# Patient Record
Sex: Female | Born: 1990 | Hispanic: Yes
Health system: Southern US, Community
[De-identification: ages and names within clinical notes are randomized; demographics above are authoritative.]

## PROBLEM LIST (undated history)

## (undated) DIAGNOSIS — O0930 Supervision of pregnancy with insufficient antenatal care, unspecified trimester: Secondary | ICD-10-CM

## (undated) DIAGNOSIS — O365990 Maternal care for other known or suspected poor fetal growth, unspecified trimester, not applicable or unspecified: Secondary | ICD-10-CM

## (undated) DIAGNOSIS — D649 Anemia, unspecified: Secondary | ICD-10-CM

## (undated) HISTORY — DX: Maternal care for other known or suspected poor fetal growth, unspecified trimester, not applicable or unspecified: O36.5990

## (undated) HISTORY — DX: Supervision of pregnancy with insufficient antenatal care, unspecified trimester: O09.30

---

## 2011-10-21 ENCOUNTER — Encounter (HOSPITAL_COMMUNITY): Payer: Self-pay | Admitting: Physical Medicine and Rehabilitation

## 2011-10-21 ENCOUNTER — Emergency Department (HOSPITAL_COMMUNITY)
Admission: EM | Admit: 2011-10-21 | Discharge: 2011-10-21 | Disposition: A | Discharge status: Home / Self Care | Payer: Worker's Compensation | Attending: Emergency Medicine | Admitting: Emergency Medicine

## 2011-10-21 DIAGNOSIS — S51809A Unspecified open wound of unspecified forearm, initial encounter: Secondary | ICD-10-CM | POA: Insufficient documentation

## 2011-10-21 DIAGNOSIS — W268XXA Contact with other sharp object(s), not elsewhere classified, initial encounter: Secondary | ICD-10-CM | POA: Insufficient documentation

## 2011-10-21 DIAGNOSIS — Y990 Civilian activity done for income or pay: Secondary | ICD-10-CM | POA: Insufficient documentation

## 2011-10-21 DIAGNOSIS — S51812A Laceration without foreign body of left forearm, initial encounter: Secondary | ICD-10-CM

## 2011-10-21 HISTORY — DX: Anemia, unspecified: D64.9

## 2011-10-21 NOTE — ED Provider Notes (Signed)
Medical screening examination/treatment/procedure(s) were performed by non-physician practitioner and as supervising physician I was immediately available for consultation/collaboration.  Ethelda Chick, MD 10/21/11 0900

## 2011-10-21 NOTE — ED Provider Notes (Signed)
History     CSN: [id number]  Arrival date & time 10/21/11  0703   First MD Initiated Contact with Patient 10/21/11 [phone]      Chief Complaint  Patient presents with  . Extremity Laceration    (Consider location/radiation/quality/duration/timing/severity/associated sxs/prior treatment) Patient is a 21 y.o. female presenting with skin laceration. The history is provided by the patient. The history is limited by a language barrier. A language interpreter was used.  Laceration  The incident occurred 1 to 2 hours ago. The laceration is located on the left arm. The laceration is 2 cm in size. The laceration mechanism was a broken glass. The pain is mild. She reports no foreign bodies present. Her tetanus status is UTD.  Denies distal weakness or numbness. Injury occurred at work.  Past Medical History  Diagnosis Date  . Anemia     No past surgical history on file.  No family history on file.  History  Substance Use Topics  . Smoking status: Never Smoker   . Smokeless tobacco: Not on file  . Alcohol Use: No    OB History    Grav Para Term Preterm Abortions TAB SAB Ect Mult Living                  Review of Systems  Constitutional: Negative for fever and chills.  Skin: Positive for wound.  Neurological: Negative for weakness and numbness.    Allergies  Review of patient's allergies indicates no known allergies.  Home Medications  No current outpatient prescriptions on file.  BP 123/69  Pulse 82  Temp 98.5 F (36.9 C) (Oral)  Resp 16  SpO2 99%  Physical Exam  Nursing note reviewed. Constitutional: She appears well-developed and well-nourished. No distress.       Vital signs are reviewed and are normal.  HENT:  Head: Normocephalic and atraumatic.  Eyes: Conjunctivae are normal.  Neck: Neck supple.  Cardiovascular: Normal rate.        Bilateral radial pulses 2+  Pulmonary/Chest: Effort normal. No respiratory distress.  Musculoskeletal: Normal range of  motion. She exhibits tenderness (only over area of laceration). She exhibits no edema.       Left elbow: Normal.       Left wrist: Normal.       Left forearm: She exhibits laceration.       Left hand: Normal. She exhibits normal capillary refill. Normal strength noted.  Neurological: She is alert.       Sensation intact to light touch in BUE.   Skin:       ED Course  Procedures (including critical care time)  Labs Reviewed - No data to display No results found.  LACERATION REPAIR Performed by: Lorenz Coaster Consent: Verbal consent obtained. Risks and benefits: risks, benefits and alternatives were discussed Patient identity confirmed: provided demographic data Time out performed prior to procedure Prepped and Draped in normal sterile fashion Wound explored  Laceration Location: left forearm  Laceration Length: 2cm  No Foreign Bodies seen or palpated  Anesthesia: local infiltration  Local anesthetic: lidocaine 2% without epinephrine  Anesthetic total: 1.5 ml  Irrigation method: syringe Amount of cleaning: standard  Skin closure: Ethilon  Number of sutures or staples: 3  Technique: simple interrupted  Patient tolerance: Patient tolerated the procedure well with no immediate complications. Bacitracin ointment, sterile dressing placed after repair by myself.   Dx 1: Left forearm laceration   MDM  Laceration left forearm. Tetanus UTD. Denies possibility of FB. Will  repair wound.        Shaaron Adler, [place] 10/21/11 [phone]

## 2011-10-21 NOTE — ED Notes (Signed)
Patient has laceration to right forearm. Injury at work with glass. Patient was given interrupter phones to communicate.

## 2011-10-21 NOTE — ED Notes (Signed)
Pt presents to department for evaluation of laceration to L forearm. Pt does not speak english, interpreter phones used in triage. States she was working at [employer] when a piece of glass cut her arm. Tetanus unknown. Bleeding controlled upon arrival to ED. She is alert and oriented x4. No acute distress noted.

## 2012-03-02 ENCOUNTER — Ambulatory Visit: Payer: Self-pay | Admitting: Family Medicine

## 2012-03-02 VITALS — BP 109/69 | HR 80 | Temp 99.1°F | Resp 18 | Ht 64.0 in | Wt 110.0 lb

## 2012-03-02 DIAGNOSIS — R110 Nausea: Secondary | ICD-10-CM

## 2012-03-02 DIAGNOSIS — Z3490 Encounter for supervision of normal pregnancy, unspecified, unspecified trimester: Secondary | ICD-10-CM

## 2012-03-02 DIAGNOSIS — Z331 Pregnant state, incidental: Secondary | ICD-10-CM

## 2012-03-02 LAB — POCT URINE PREGNANCY: Preg Test, Ur: POSITIVE

## 2012-03-02 MED ORDER — PRENATAL VITAMINS (DIS) PO TABS: 1.0000 | ORAL_TABLET | Freq: Every day | ORAL | Status: DC

## 2012-03-02 NOTE — Patient Instructions (Addendum)
Embarazo  Primer trimestre  (Pregnancy - First Trimester)  Durante el acto sexual, millones de espermatozoides entran en la vagina. Slo 1 espermatozoide penetra y fertiliza al vulo mientras se encuentra en la trompa de Falopio. Una semana ms tarde, el vulo fertilizado se implanta en la pared del tero. Un embrin comienza a desarrollarse para ser un beb. A las 6 a 8 semanas se forman los ojos y la cara y los latidos del corazn se pueden ver en la ecografa. Al final de las 12 semanas (primer trimestre) todos los rganos del beb estn formados. Ahora que est embarazada, querr hacer todo lo que est a su alcance para tener un beb sano. Dos de las cosas ms importantes son: tener una buena atencin prenatal y seguir las indicaciones del profesional que la asiste. La atencin prenatal incluye toda la asistencia mdica que usted recibe antes del nacimiento del beb. Se lleva a cabo para prevenir y tratar problemas durante el embarazo y el parto. EXAMENES PRENATALES   Durante las visitas prenatales se controlan el peso, la presin arterial y se solicitan anlisis de orina. Esto se hace para asegurarse de que usted est sana y el embarazo progrese normalmente.  Una mujer embarazada debe aumentar de 25 a 35 libras durante el embarazo. Sin embargo, si usted tiene sobrepeso o bajo peso, su mdico le aconsejar qu hacer.  El podr hacerle preguntas y responder todas las que usted le haga.  Durante los exmenes prenatales se solicitan anlisis de sangre, cultivos del tero, un [place] y otros anlisis necesarios. Estas pruebas se realizan para controlar su salud y la del beb. Se recomienda que se haga la prueba para el diagnstico del VIH, con su autorizacin. Este es el virus que causa el SIDA. Estas pruebas se realizan porque existen medicamentos que podran administrarle para prevenir que el beb nazca con esta infeccin, si usted estuviera infectada y no lo supiera. Los anlisis de sangre tambin  se realizan para determinar el tipo de sangre, si tuvo infecciones previas y controlar sus niveles en la sangre (hemoglobina).  Tener un recuento bajo de hemoglobina (anemia) es comn durante el embarazo. Para prevenirla, se administran hierro y vitaminas. En una etapa ms avanzada del embarazo, le indicarn exmenes de sangre para saber si tiene diabetes, junto con otros anlisis, en caso de que tuviera problemas. Es necesario que se haga las pruebas para asegurarse de que usted y el beb estn bien.  Es posible que necesite otras pruebas adicionales. CAMBIOS DURANTE EL PRIMER TRIMESTRE (LOS TRES PRIMEROS MESES DEL EMBARAZO).  Su organismo atravesar numerosos cambios durante el embarazo. Estos pueden variar de una persona a otra. Converse con el mdico acerca los cambios que usted nota y que la preocupan. Ellos son:   El perodo menstrual se detiene.  El vulo y los espermatozoides llevan los genes que determinan cmo seremos. Sus genes y los de su pareja forman el beb. Los genes del varn determinan si ser un nio o una nia.  La circunferencia de la cintura va a ir aumentando y podr sentirse hinchada.  Puede tener malestar estomacal (nuseas) y vmitos. Si no puede controlar los vmitos, consulte a su mdico.  Sus mamas comenzarn a agrandarse y sensibilizarse.  Los pezones pueden sobresalir ms y ser ms oscuros.  Tendr necesidad de orinar ms. El dolor al orinar puede significar que usted tiene una infeccin de la vejiga.  Se cansar con facilidad.  Prdida del apetito.  Sentir un fuerte deseo de consumir ciertos alimentos.    Al principio, usted puede ganar o perder un par de kilos.  Podr tener cambios emocionales de un da a otro (entusiasmo por estar embarazada o preocupacin por el embarazo y el beb).  Tendr sueos ms vvidos y extraos. INSTRUCCIONES PARA EL CUIDADO EN EL HOGAR   Es muy importante evitar el cigarrillo, el alcohol y los frmacos no recetados  durante el embarazo. Estas sustancias afectan la formacin y el desarrollo del beb. Evite los productos qumicos durante el embarazo para asegurar la salud del beb.  Comience las consultas prenatales alrededor de la 12 semana de embarazo. Generalmente se programan cada mes al principio y se hacen ms frecuentes en los 2 ltimos meses antes del parto. Cumpla con las citas de control. Siga las indicaciones del mdico con respecto al uso de medicamentos, los anlisis y pruebas de laboratorio, los ejercicios y la dieta.  Durante el embarazo debe obtener nutrientes para usted y para su beb. Consuma alimentos balanceados. Elija alimentos como carne, pescado, leche y otros productos lcteos descremados, vegetales, frutas, panes integrales y cereales. El mdico le informar cul es el aumento de peso ideal.  Las nuseas matinales pueden aliviarse si come algunas galletitas saladas en la cama. Coma dos galletitas antes de levantarse por la maana. Tambin puede comer galletitas sin sal.  Hacer 4 o 5 comidas pequeas en lugar de 3 comidas grandes por da tambin puede aliviar las nuseas y los vmitos.  Beber lquidos entre las comidas en lugar de tomarlos durante las comidas tambin puede ayudar a calmar las nuseas y los vmitos.  Puede continuar teniendo relaciones sexuales durante todo el embarazo si no hay otros problemas. Los problemas pueden ser una prdida precoz (prematura) de lquido amnitico, sangrado vaginal, o dolor en el vientre (abdominal).  Realice actividad fsica todos los das, si no tiene restricciones. Consulte con su mdico o terapeuta fsico si no est segura de algunos de sus ejercicios. El mayor aumento de peso se producir en los ltimos 2 trimestres del embarazo. El ejercicio le ayudar a:  Controlar su peso.  Mantenerse en forma.  Prepararse para el parto.  La ayudar a perder el peso del embarazo despus de que nazca su beb.  Use un buen sostn o como los que se usan  para hacer deportes para aliviar la sensibilidad de las mamas. Tambin puede serle til si lo usa mientras duerme.  Consulte cuando puede comenzar con las clases de pre parto. Comience con las clases cuando estn disponibles.  No utilice la baera con agua caliente, baos turcos y saunas.  Colquese el cinturn de seguridad cuando conduzca. Este la proteger a usted y al beb en caso de accidente.  Evite comer carne cruda y el contacto con los utensilios y desperdicios de los gatos. Estos elementos contienen grmenes que pueden causar defectos de nacimiento en el beb.  El primer trimestre es un buen momento para visitar a su dentista y evaluar su salud dental. Es importante mantener los dientes limpios. Use un cepillo de dientes suave y cepllese con ms suavidad durante el embarazo.  Pida ayuda si tienen necesidades financieras, teraputicas o nutricionales. El profesional podr ayudarla con respecto a estas necesidades, o derivarla a otros especialistas.  No tome medicamentos o hierbas excepto aquellos que le indic el profesional.  Informe a su mdico si sufre violencia familiar mental o fsica.  Haga una lista de nmeros de telfono de emergencia de la familia, los amigos, el hospital y los departamentos de polica y bomberos.  Escriba sus   preguntas. Llvelas cuando concurra a su visita prenatal.  No se haga duchas vaginales.  No cruce las piernas.  Si usted tiene que estar parada por largos perodos de tiempo, gire los pies o de pequeos pasos en crculo.  Es posible que tenga ms secreciones vaginales que puedan requerir una toalla higinica. No use tampones o toallas higinicas perfumadas. EL CONSUMO DE MEDICAMENTOS Y FRMACOS DURANTE EL EMBARAZO   Tome las vitaminas para la etapa prenatal tal como se le indic. Las vitaminas deben contener un miligramo de cido flico. Guarde todas las vitaminas fuera del alcance de los nios. La ingestin de slo un par de vitaminas o  tabletas que contengan hierro pueden ocasionar la muerte en un beb o en un nio pequeo.  Evite el uso de todos los medicamentos, incluyendo hierbas, medicamentos de venta libre, sin receta o que no hayan sido sugeridos por su mdico. Slo tome medicamentos de venta libre o medicamentos recetados para el dolor, el malestar o fiebre como lo indique su mdico. No tome aspirina, ibuprofeno o naproxeno excepto que su mdico se lo indique.  Infrmele al profesional si consume medicamentos de hierbas.  El alcohol se relaciona con ciertos defectos congnitos. Incluye el sndrome de alcoholismo fetal. Debe evitar absolutamente el consumo de alcohol, en cualquier forma. El fumar causar baja tasa de natalidad y bebs prematuros.  Las drogas ilegales o de la calle son muy perjudiciales para el beb. Estn absolutamente prohibidas. Un beb que nace de una madre adicta, ser adicto al nacer. Ese beb tendr los mismos sntomas de abstinencia que un adulto.  Informe a su mdico acerca de los medicamentos que ha tomado y el motivo por el que los tom. EL ABORTO ESPONTNEO ES COMN DURANTE EL EMBARAZO  Esto no significa que haya hecho algo mal. No es un motivo para preocuparse en caso de un nuevo embarazo. El profesional que la asiste la ayudar si tiene preguntas para formular. Si tiene un aborto espontneo podr requerir una ciruga menor.  SOLICITE ATENCIN MDICA SI:  Tiene preguntas o preocupaciones relacionadas con el embarazo. Es mejor que llame para formular las preguntas si no puede esperar hasta la prxima visita, que sentirse preocupada por ellas.  SOLICITE ATENCIN MDICA DE INMEDIATO SI:   La temperatura oral le sube a ms de 102 F (38.9 C) o lo que su mdico le indique.  Tiene una prdida de lquido por la vagina (canal de parto). Si sospecha una ruptura de las membranas, tmese la temperatura y llame al profesional para informarlo sobre esto.  Observa unas pequeas manchas o una hemorragia  vaginal. Notifique al profesional acerca de la cantidad y de cuntos apsitos est utilizando.  Presenta un olor desagradable en la secrecin vaginal y observa un cambio en el color.  Contina con las nuseas y no obtiene alivio de los remedios indicados. Vomita sangre o algo similar a la borra del caf.  Pierde ms de 2 libras (1 Kg) en una semana.  Aumenta ms de 1 Kg en una semana y nota el rostro, las manos, los pies o las piernas hinchados.  Aumenta ms de 2,5 Kg en una semana (aunque no tenga las manos, pies, piernas o el rostro hinchados).  Ha estado expuesta a la rubola y no ha sufrido la enfermedad.  Ha estado expuesta a la quinta enfermedad o a la varicela.  Siente dolor en el vientre (abdominal). Las molestias en el ligamento redondo son una causa benigna frecuente de dolor abdominal durante el embarazo. El   profesional que la asiste deber evaluarla.  Presenta dolor de cabeza, fiebre, diarrea, dolor al orinar o le falta la respiracin.  Se cae, se ve involucrada en un accidente automovilstico o sufre algn tipo de traumatismo.  En su hogar hay violencia mental o fsica. Document Released: 01/12/2005 Document Revised: 10/04/2011 [employer] Patient Information 2013 [employer].  

## 2012-03-02 NOTE — Progress Notes (Signed)
Subjective:    Patient ID: Angelica Montgomery, female    DOB: 1990/11/01, 21 y.o.   MRN: [id number]  HPI Angelica Montgomery is a 21 y.o. female  Here for pregnancy test.  Feels nauseous, no vomiting.  Has not been eating much past few days. Drinking fluids ok.  Home pregnancy test positive 2 days ago.  Drinking Ensure.  Unintentional pregnancy, but plans on having it. Headache this week - no medicine.   LMP: October 1st.  Spanish speaking only - here with interpreter.   Review of Systems  Gastrointestinal: Positive for nausea. Negative for vomiting.  Genitourinary: Negative for vaginal bleeding, vaginal pain and pelvic pain.  Neurological: Positive for headaches.   Works at [employer] - unable to work this past week due to nausea.     Objective:   Physical Exam  Vitals reviewed. Constitutional: She appears well-developed and well-nourished.  HENT:  Head: Normocephalic and atraumatic.  Pulmonary/Chest: Effort normal and breath sounds normal.  Abdominal: Soft. She exhibits no distension and no mass. There is no tenderness. There is no guarding.  Skin: Skin is warm and dry.  Psychiatric: She has a normal mood and affect.    Results for orders placed in visit on 03/02/12  POCT URINE PREGNANCY      Component Value Range   Preg Test, Ur Positive        Assessment & Plan:  Angelica Montgomery is a 21 y.o. female 1. Pregnancy  POCT urine pregnancy, Prenatal Vitamins (DIS) TABS  2. Nausea alone     Unintentional pregnancy, but plans on continuing pregnancy.  PNV QD , handout on  Common questions.  Discussed avoiding otc meds other than tylenol for now, tips on nausea mgt. Phone numbers to [facility] and other Obstetricians in area given for her to call and schedule initial appointment.  rtc precautions discussed.  Out of work letter provided, but if nausea not improved to be able to work after one week of home care - rtc to discuss other mgt options.  Headache likely with  decreased appetite. Try otc tylenol.  rtc if worsening or not improving.   Patient Instructions  Risk manager trimestre  (Pregnancy - First Trimester)  Durante el acto sexual, millones de espermatozoides entran en la vagina. Slo 1 espermatozoide penetra y fertiliza al vulo mientras se encuentra en la trompa de [place]. Una semana ms tarde, el vulo fertilizado se implanta en la pared del tero. Un embrin comienza a desarrollarse para ser un beb. A las 6 a 8 semanas se forman los ojos y la cara y los latidos del corazn se pueden ver en la ecografa. Al final de las 12 semanas (primer trimestre) todos los rganos del beb estn formados. Ahora que est embarazada, querr hacer todo lo que est a su alcance para tener un beb sano. Dos de las cosas ms importantes son: Winferd Humphrey buena atencin prenatal y seguir las indicaciones del profesional que la asiste. La atencin prenatal incluye toda la asistencia mdica que usted recibe antes del nacimiento del beb. Se lleva a cabo para prevenir y tratar problemas durante el [address] y [place]. EXAMENES PRENATALES   Durante las visitas prenatales se [employer], la presin arterial y se solicitan anlisis de [place]. Esto se hace para asegurarse de que usted est sana y el embarazo progrese normalmente.  Una mujer embarazada debe aumentar de 25 a 35 libras durante el embarazo. Sin embargo, si usted tiene sobrepeso o bajo  peso, su mdico Administrator, Civil Service.  El podr hacerle preguntas y responder todas las que usted le haga.  Durante los exmenes prenatales se solicitan anlisis de [place], cultivos del [place], un [place] y otros anlisis necesarios. Estas pruebas se realizan para controlar su salud y la del beb. Se recomienda que se haga la prueba para el diagnstico del VIH, con su autorizacin. Este es el virus que causa el [place]. Estas pruebas se realizan porque existen medicamentos que podran administrarle para prevenir que el beb  nazca con esta infeccin, si usted estuviera infectada y no lo supiera. Los [employer] Corporation de sangre tambin se Radiographer, therapeutic para Warehouse manager tipo de [place], si tuvo infecciones previas y Chief Operating Officer sus niveles en la sangre (hemoglobina).  Tener un recuento bajo de hemoglobina (anemia) es comn durante Firefighter. Para prevenirla, se administran hierro y vitaminas. En una etapa ms avanzada del [place], le indicarn exmenes de sangre para saber si tiene diabetes, junto con otros anlisis, en caso de que [place]. Es necesario que se haga las pruebas para asegurarse de que usted y el beb estn bien.  Es posible que necesite otras pruebas adicionales. CAMBIOS DURANTE EL PRIMER TRIMESTRE (LOS TRES PRIMEROS MESES DEL EMBARAZO).  Su organismo atravesar numerosos cambios Academic librarian. Estos pueden variar de Neomia Dear persona a otra. Converse con el mdico acerca los cambios que usted nota y que la preocupan. Ellos son:   El perodo menstrual se detiene.  El vulo y los espermatozoides llevan los genes que determinan cmo seremos. Sus genes y los de su pareja forman el beb. Los genes del varn determinan si ser un nio o una nia.  La circunferencia de la cintura va a ir aumentando y podr sentirse hinchada.  Puede tener Programme researcher, broadcasting/film/video (nuseas) y vmitos. Si no puede controlar los vmitos, consulte a su mdico.  Sus mamas comenzarn a agrandarse y sensibilizarse.  Los pezones pueden sobresalir ms y ser ms oscuros.  Tendr necesidad de orinar ms. El dolor al orinar puede significar que usted tiene una infeccin de la vejiga.  Se cansar con facilidad.  Prdida del apetito.  Sentir un fuerte deseo de consumir ciertos alimentos.  Al principio, usted puede ganar o perder un par de kilos.  Podr tener cambios emocionales de un da a otro (entusiasmo por estar embarazada o preocupacin por Firefighter y el beb).  Tendr sueos ms vvidos y extraos. INSTRUCCIONES PARA EL  CUIDADO EN EL HOGAR   Es muy importante evitar el cigarrillo, el alcohol y los frmacos no recetados Academic librarian. Estas sustancias afectan la formacin y el desarrollo del beb. Evite los productos qumicos durante el embarazo para Games developer salud del beb.  Comience las consultas prenatales alrededor de la 12 semana de [place]. Generalmente se programan cada mes al principio y se hacen ms frecuentes en los 2 ltimos meses antes del parto. Cumpla con las citas de control. Siga las indicaciones del mdico con respecto al uso de [place], los anlisis y pruebas de [place], los ejercicios y Psychologist, forensic.  Durante el embarazo debe obtener nutrientes para usted y para su beb. Consuma alimentos balanceados. Elija alimentos como carne, pescado, [place] y otros productos lcteos descremados, vegetales, frutas, panes integrales y cereales. El [employer]ot informar cul es el aumento de peso ideal.  Las nuseas matinales pueden aliviarse si come algunas galletitas saladas en la cama. Coma dos galletitas antes de levantarse por la maana. Tambin puede comer galletitas sin sal.  Hacer 4 o  5 comidas pequeas en lugar de 3 comidas grandes por da tambin puede Yahoo nuseas y los vmitos.  Beber lquidos [place] comidas en lugar de tomarlos durante las comidas tambin puede ayudar a Optician, dispensing las nuseas y los vmitos.  Puede continuar teniendo [employer] durante todo el embarazo si no hay otros problemas. Los problemas pueden ser una prdida precoz (prematura) de lquido amnitico, sangrado vaginal, o dolor en el vientre (abdominal).  Realice Tesoro Corporation, si no tiene restricciones. Consulte con su mdico o terapeuta fsico si no est segura de algunos de sus ejercicios. El mayor aumento de peso se producir en los ltimos 2 trimestres del Psychiatrist. El ejercicio le ayudar a:  Engineering geologist.  Mantenerse en forma.  Prepararse para el parto.  La ayudar a  perder el peso del embarazo despus de que nazca su beb.  Use un buen sostn o como los que se usan para hacer deportes para Paramedic la sensibilidad de las [place]. Tambin puede serle til si lo [place] mientras duerme.  Consulte cuando puede comenzar con las clases de pre parto. Comience con las clases cuando estn disponibles.  No utilice la baera con agua caliente, baos turcos y saunas.  Colquese el cinturn de seguridad cuando conduzca. Este la proteger a usted y al beb en caso de accidente.  Evite comer carne cruda y el contacto con los utensilios y desperdicios de los gatos. Estos elementos contienen grmenes que pueden causar defectos de nacimiento en el beb.  El primer trimestre es un buen momento para visitar a su dentista y Software engineer. Es importante mantener los dientes limpios. Use un cepillo de dientes suave y cepllese con ms suavidad durante el [employer] Lots.  Pida ayuda si tienen necesidades financieras, teraputicas o nutricionales. El profesional podr ayudarla con respecto a estas necesidades, o derivarla a otros especialistas.  No tome medicamentos o hierbas excepto aquellos que Fish farm manager.  Informe a su mdico si sufre violencia familiar mental o fsica.  Haga una lista de nmeros de telfono de Associate Professor de la familia, los amigos, el hospital y los departamentos de polica y bomberos.  Escriba sus preguntas. Llvelas cuando concurra a su visita prenatal.  No se haga duchas vaginales.  No cruce las piernas.  Si usted tiene que estar parada por largos perodos de [place], gire los pies o de pequeos pasos en crculo.  Es posible que tenga ms secreciones vaginales que puedan requerir una toalla higinica. No use tampones o toallas higinicas perfumadas. EL CONSUMO DE MEDICAMENTOS Y FRMACOS DURANTE EL EMBARAZO   Tome las vitaminas para la etapa prenatal tal como se le indic. Las vitaminas deben contener un miligramo de cido flico. Guarde  todas las vitaminas fuera del alcance de los nios. La ingestin de slo un par de vitaminas o tabletas que contengan hierro pueden ocasionar la [employer] Mining en un beb o en un nio pequeo.  Evite el uso de The Mutual of Omaha, incluyendo hierbas, medicamentos de [place], sin receta o que no hayan sido sugeridos por su mdico. Slo tome medicamentos de venta libre o medicamentos recetados para Chief Technology Officer, Environmental health practitioner o fiebre como lo indique su mdico. No tome aspirina, ibuprofeno o naproxeno excepto que su mdico se lo indique.  Infrmele al profesional si consume medicamentos de hierbas.  El alcohol se relaciona con ciertos defectos congnitos. Incluye el sndrome de alcoholismo fetal. Debe evitar absolutamente el consumo de alcohol, en cualquier forma. El fumar causar baja  tasa de natalidad y bebs prematuros.  Las drogas ilegales o de la calle son muy perjudiciales para el beb. Estn absolutamente prohibidas. Un beb que nace de [employer] Express, ser adicto al nacer. Ese beb tendr los mismos sntomas de abstinencia que un adulto.  Informe a su mdico acerca de los medicamentos que ha tomado y el motivo por el que los tom. EL ABORTO ESPONTNEO ES COMN DURANTE EL EMBARAZO  Esto no significa que haya hecho algo mal. No es un motivo para preocuparse en caso de un nuevo embarazo. El profesional que la asiste la ayudar si tiene preguntas para formular. Si tiene un aborto espontneo podr requerir Futures trader.  SOLICITE ATENCIN MDICA SI:  Tiene preguntas o preocupaciones relacionadas con el embarazo. Es mejor que llame para formular las preguntas si no puede esperar hasta la prxima visita, que sentirse preocupada por ellas.  SOLICITE ATENCIN MDICA DE INMEDIATO SI:   La temperatura oral le sube a ms de 102 F (38.9 C) o lo que su mdico le indique.  Tiene una prdida de lquido por la vagina (canal de parto). Si sospecha una ruptura de las [place], tmese la temperatura y llame  al profesional para informarlo sobre esto.  Observa unas pequeas manchas o una hemorragia vaginal. Notifique al profesional acerca de la cantidad y de cuntos apsitos est utilizando.  Presenta un olor desagradable en la secrecin vaginal y observa un cambio en el color.  Contina con las nuseas y no obtiene alivio de los remedios indicados. Vomita sangre o algo similar a la borra del caf.  Pierde ms de 2 libras (1 Kg) en una semana.  Aumenta ms de 1 Kg en una semana y [address], las manos, los pies o las piernas hinchados.  Aumenta ms de 2,5 Kg en una semana (aunque no tenga las manos, pies, piernas o el rostro hinchados).  Ha estado expuesta a la rubola y no ha sufrido la enfermedad.  Ha estado expuesta a la quinta enfermedad o a la varicela.  Siente dolor en el vientre (abdominal). Las [employer] en el ligamento redondo son Neomia Dear causa benigna frecuente de dolor abdominal durante el embarazo. El profesional que la asiste deber evaluarla.  Presenta dolor de Renne Musca, diarrea, dolor al orinar o le falta la respiracin.  Se cae, se ve involucrada en un accidente automovilstico o sufre algn tipo de traumatismo.  En su hogar hay violencia mental o fsica. Document Released: 01/12/2005 Document Revised: 10/04/2011 [facility] Patient Information 2013 [place], [place].

## 2012-03-21 ENCOUNTER — Telehealth: Payer: Self-pay | Admitting: Radiology

## 2012-03-21 NOTE — Telephone Encounter (Signed)
Patient requested letter stating her due date. Letter provided she had positive pregnancy test on 03/02/12

## 2012-04-18 NOTE — L&D Delivery Note (Signed)
Delivery Note At 4:43 PM a viable female was delivered via Vaginal, Spontaneous Delivery (Presentation: Left Occiput Anterior).  APGAR: 9, 9; weight .   Placenta status: Intact, Spontaneous.  Cord: 3 vessels with the following complications: Nuchal x 1 reduced.  Anesthesia: None  Episiotomy: None Lacerations: 2nd degree;Sulcus (left) Suture Repair: 3.0 vicryl rapide Est. Blood Loss (mL):   Mom to postpartum.  Baby to stay with mother. Pt asked with translator and declines circumcision.  Oliver Pila 10/23/2012, 5:34 PM

## 2012-07-18 ENCOUNTER — Other Ambulatory Visit: Payer: Self-pay

## 2012-07-18 DIAGNOSIS — Z3201 Encounter for pregnancy test, result positive: Secondary | ICD-10-CM

## 2012-07-19 ENCOUNTER — Encounter: Payer: Self-pay | Admitting: *Deleted

## 2012-07-19 LAB — OBSTETRIC PANEL
Eosinophils Absolute: 0 10*3/uL (ref 0.0–0.7)
Hepatitis B Surface Ag: NEGATIVE
Lymphocytes Relative: 15 % (ref 12–46)
Lymphs Abs: 1.8 10*3/uL (ref 0.7–4.0)
MCH: 30.9 pg (ref 26.0–34.0)
Neutro Abs: 9.2 10*3/uL — ABNORMAL HIGH (ref 1.7–7.7)
Neutrophils Relative %: 78 % — ABNORMAL HIGH (ref 43–77)
Platelets: 335 10*3/uL (ref 150–400)
RBC: 3.59 MIL/uL — ABNORMAL LOW (ref 3.87–5.11)
WBC: 11.8 10*3/uL — ABNORMAL HIGH (ref 4.0–10.5)

## 2012-07-20 LAB — HEMOGLOBINOPATHY EVALUATION
Hemoglobin Other: 0 %
Hgb A: 97.2 % (ref 96.8–97.8)
Hgb S Quant: 0 %

## 2012-08-27 LAB — OB RESULTS CONSOLE HIV ANTIBODY (ROUTINE TESTING): HIV: NONREACTIVE

## 2012-08-27 LAB — OB RESULTS CONSOLE ABO/RH: RH Type: POSITIVE

## 2012-08-27 LAB — OB RESULTS CONSOLE RUBELLA ANTIBODY, IGM: Rubella: IMMUNE

## 2012-08-27 LAB — OB RESULTS CONSOLE GC/CHLAMYDIA: Chlamydia: NEGATIVE

## 2012-09-13 ENCOUNTER — Other Ambulatory Visit: Payer: Self-pay | Admitting: Family Medicine

## 2012-10-22 ENCOUNTER — Telehealth (HOSPITAL_COMMUNITY): Payer: Self-pay | Admitting: *Deleted

## 2012-10-22 ENCOUNTER — Encounter (HOSPITAL_COMMUNITY): Payer: Self-pay | Admitting: *Deleted

## 2012-10-22 NOTE — Telephone Encounter (Signed)
Preadmission screen Interpreter number [phone]

## 2012-10-23 ENCOUNTER — Encounter (HOSPITAL_COMMUNITY): Payer: Self-pay

## 2012-10-23 ENCOUNTER — Inpatient Hospital Stay (HOSPITAL_COMMUNITY)
Admission: AD | Admit: 2012-10-23 | Discharge: 2012-10-25 | DRG: 775 | Disposition: A | Discharge status: Home / Self Care | Payer: Medicaid Other | Source: Ambulatory Visit | Attending: Obstetrics and Gynecology | Admitting: Obstetrics and Gynecology

## 2012-10-23 DIAGNOSIS — Z141 Cystic fibrosis carrier: Secondary | ICD-10-CM

## 2012-10-23 DIAGNOSIS — O365990 Maternal care for other known or suspected poor fetal growth, unspecified trimester, not applicable or unspecified: Secondary | ICD-10-CM | POA: Diagnosis present

## 2012-10-23 LAB — CBC
MCH: 31.1 pg (ref 26.0–34.0)
MCHC: 34.5 g/dL (ref 30.0–36.0)
MCV: 90.4 fL (ref 78.0–100.0)
Platelets: 251 10*3/uL (ref 150–400)
RBC: 3.63 MIL/uL — ABNORMAL LOW (ref 3.87–5.11)
RDW: 13.6 % (ref 11.5–15.5)

## 2012-10-23 MED ORDER — LACTATED RINGERS IV SOLN
INTRAVENOUS | Status: DC
  Administered 2012-10-23: 16:00:00 via INTRAVENOUS

## 2012-10-23 MED ORDER — CITRIC ACID-SODIUM CITRATE 334-500 MG/5ML PO SOLN: 30.0000 mL | ORAL | Status: DC | PRN

## 2012-10-23 MED ORDER — ONDANSETRON HCL 4 MG/2ML IJ SOLN: 4.0000 mg | INTRAMUSCULAR | Status: DC | PRN

## 2012-10-23 MED ORDER — ONDANSETRON HCL 4 MG/2ML IJ SOLN: 4.0000 mg | Freq: Four times a day (QID) | INTRAMUSCULAR | Status: DC | PRN

## 2012-10-23 MED ORDER — OXYTOCIN 40 UNITS IN LACTATED RINGERS INFUSION - SIMPLE MED
62.5000 mL/h | INTRAVENOUS | Status: DC
  Filled 2012-10-23: qty 1000

## 2012-10-23 MED ORDER — FLEET ENEMA 7-19 GM/118ML RE ENEM: 1.0000 | ENEMA | RECTAL | Status: DC | PRN

## 2012-10-23 MED ORDER — OXYCODONE-ACETAMINOPHEN 5-325 MG PO TABS: 1.0000 | ORAL_TABLET | ORAL | Status: DC | PRN

## 2012-10-23 MED ORDER — SENNOSIDES-DOCUSATE SODIUM 8.6-50 MG PO TABS
2.0000 | ORAL_TABLET | Freq: Every day | ORAL | Status: DC
  Administered 2012-10-24: 2 via ORAL

## 2012-10-23 MED ORDER — WITCH HAZEL-GLYCERIN EX PADS: 1.0000 "application " | MEDICATED_PAD | CUTANEOUS | Status: DC | PRN

## 2012-10-23 MED ORDER — ONDANSETRON HCL 4 MG PO TABS: 4.0000 mg | ORAL_TABLET | ORAL | Status: DC | PRN

## 2012-10-23 MED ORDER — IBUPROFEN 600 MG PO TABS
600.0000 mg | ORAL_TABLET | Freq: Four times a day (QID) | ORAL | Status: DC | PRN
  Filled 2012-10-23: qty 1

## 2012-10-23 MED ORDER — ACETAMINOPHEN 325 MG PO TABS: 650.0000 mg | ORAL_TABLET | ORAL | Status: DC | PRN

## 2012-10-23 MED ORDER — BENZOCAINE-MENTHOL 20-0.5 % EX AERO
1.0000 "application " | INHALATION_SPRAY | CUTANEOUS | Status: DC | PRN
  Filled 2012-10-23: qty 56

## 2012-10-23 MED ORDER — LANOLIN HYDROUS EX OINT: TOPICAL_OINTMENT | CUTANEOUS | Status: DC | PRN

## 2012-10-23 MED ORDER — LACTATED RINGERS IV SOLN: 500.0000 mL | INTRAVENOUS | Status: DC | PRN

## 2012-10-23 MED ORDER — PRENATAL MULTIVITAMIN CH
1.0000 | ORAL_TABLET | Freq: Every day | ORAL | Status: DC
  Administered 2012-10-24 – 2012-10-25 (×2): 1 via ORAL
  Filled 2012-10-23 (×2): qty 1

## 2012-10-23 MED ORDER — OXYTOCIN BOLUS FROM INFUSION
500.0000 mL | INTRAVENOUS | Status: DC
  Administered 2012-10-23: 500 mL via INTRAVENOUS

## 2012-10-23 MED ORDER — IBUPROFEN 600 MG PO TABS
600.0000 mg | ORAL_TABLET | Freq: Four times a day (QID) | ORAL | Status: DC
  Administered 2012-10-23 – 2012-10-25 (×9): 600 mg via ORAL
  Filled 2012-10-23 (×8): qty 1

## 2012-10-23 MED ORDER — BUTORPHANOL TARTRATE 1 MG/ML IJ SOLN: 1.0000 mg | INTRAMUSCULAR | Status: DC | PRN

## 2012-10-23 MED ORDER — SIMETHICONE 80 MG PO CHEW: 80.0000 mg | CHEWABLE_TABLET | ORAL | Status: DC | PRN

## 2012-10-23 MED ORDER — TETANUS-DIPHTH-ACELL PERTUSSIS 5-2.5-18.5 LF-MCG/0.5 IM SUSP: 0.5000 mL | Freq: Once | INTRAMUSCULAR | Status: DC

## 2012-10-23 MED ORDER — ZOLPIDEM TARTRATE 5 MG PO TABS: 5.0000 mg | ORAL_TABLET | Freq: Every evening | ORAL | Status: DC | PRN

## 2012-10-23 MED ORDER — DIBUCAINE 1 % RE OINT: 1.0000 "application " | TOPICAL_OINTMENT | RECTAL | Status: DC | PRN

## 2012-10-23 MED ORDER — DIPHENHYDRAMINE HCL 25 MG PO CAPS: 25.0000 mg | ORAL_CAPSULE | Freq: Four times a day (QID) | ORAL | Status: DC | PRN

## 2012-10-23 MED ORDER — LIDOCAINE HCL (PF) 1 % IJ SOLN
30.0000 mL | INTRAMUSCULAR | Status: DC | PRN
  Filled 2012-10-23 (×2): qty 30

## 2012-10-23 NOTE — MAU Note (Signed)
Patient is in with c/o ctx q33m since morning and vaginal bleeding. No pad, a spot of blood mixed with mucus on her perinuem. Denies lof. Reports good fetal movement

## 2012-10-23 NOTE — H&P (Signed)
Angelica Montgomery is a 22 y.o. female G1P0 at 86 6/7 weeks (EDD 10/24/12 by LMP c/w 30 week [place])  Presented to [facility] for contractions and observed about 2 hours with cervix changing from 2-3cm to 7cm and patient beginning to involuntarily push.  Pt transferred to L&Angelica and quickly progressed to complete dilation with SROM.  I arrived as pt began pushing.  Prenatal care significant for late start at 30 weeks.  Pt is spanish speaking only.  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  At 35 weeks an US showed baby at 21st %ile and pt continued to measure S<Angelica so had NST's twice weekly that remained reassuring.  Plan was induction at 40 weeks as long as testing reassuring, since late care and dating not perhaps accurate. Maternal Medical History:  Reason for admission: Contractions.   Contractions: Onset was 1-2 hours ago.   Frequency: regular.   Perceived severity is strong.    Fetal activity: Perceived fetal activity is normal.    Prenatal Complications - Diabetes: none.    OB History   Grav Para Term Preterm Abortions TAB SAB Ect Mult Living   1              Past Medical History  Diagnosis Date  . Anemia   . Late prenatal care   . IUGR, antenatal    History reviewed. No pertinent past surgical history. Family History: family history is not on file. Social History:  reports that she has never smoked. She does not have any smokeless tobacco history on file. She reports that she does not drink alcohol or use illicit drugs.   Prenatal Transfer Tool  Maternal Diabetes: No Genetic Screening  Too late to care Maternal Ultrasounds/Referrals: Abnormal:  Findings:   Other:  Possible SGA Fetal Ultrasounds or other Referrals:  None Maternal Substance Abuse:  No Significant Maternal Medications:  None Significant Maternal Lab Results:  None Other Comments:  None  ROS  Dilation: 10 Effacement (%): 90 Station: +2 Exam by:: Angelica Biggs, RN Blood pressure 118/85, pulse 84, temperature  98.6 F (37 C), temperature source Oral, resp. rate 18, height 5\' 3"  (1.6 m), weight 64.411 kg (142 lb), last menstrual period 01/18/2012. Maternal Exam:  Uterine Assessment: Contraction strength is firm.  Contraction frequency is regular.   Abdomen: Patient reports no abdominal tenderness. Fetal presentation: vertex  Introitus: Normal vulva. Normal vagina.    Physical Exam  Constitutional: She appears well-developed and well-nourished.  Cardiovascular: Normal rate and regular rhythm.   Respiratory: Effort normal and breath sounds normal.  GI: Soft.  Genitourinary: Vagina normal.  Psychiatric:  Screaming with contractions    Prenatal labs: ABO, Rh: O/Positive/-- (05/12 0000) Antibody: Negative (05/12 0000) Rubella: Immune (05/12 0000) RPR: Nonreactive (05/12 0000)  HBsAg: Negative (05/12 0000)  HIV: Non-reactive (05/12 0000)  GBS: Negative (06/05 0000)  Too late for genetic screens One hour GCT 115 CF carrier, but FOB tested negative  Assessment/Plan: I arrived when pt began pushing, she did well and delivered shortly thereafter, see delivery note.   Oliver Pila 10/23/2012, 5:26 PM

## 2012-10-23 NOTE — Progress Notes (Signed)
Dr Senaida Ores notified of patient, tracing, ctx pattern, sve results (a big change from 2.5cm to 7.5cm/90/bulging). She is aware that patient is in rm 167. Orders received.

## 2012-10-24 ENCOUNTER — Inpatient Hospital Stay (HOSPITAL_COMMUNITY): Admission: RE | Admit: 2012-10-24 | Payer: Medicaid Other | Source: Ambulatory Visit

## 2012-10-24 ENCOUNTER — Inpatient Hospital Stay (HOSPITAL_COMMUNITY): Admission: RE | Admit: 2012-10-24 | Payer: Self-pay | Source: Ambulatory Visit

## 2012-10-24 LAB — CBC
HCT: 27.3 % — ABNORMAL LOW (ref 36.0–46.0)
Hemoglobin: 9.4 g/dL — ABNORMAL LOW (ref 12.0–15.0)
MCV: 90.4 fL (ref 78.0–100.0)
RDW: 13.8 % (ref 11.5–15.5)
WBC: 14.5 10*3/uL — ABNORMAL HIGH (ref 4.0–10.5)

## 2012-10-24 LAB — RPR: RPR Ser Ql: NONREACTIVE

## 2012-10-24 NOTE — Progress Notes (Signed)
Post Partum Day 1 Subjective: no complaints, up ad lib and tolerating PO  Objective: Blood pressure 116/79, pulse 84, temperature 98.5 F (36.9 C), temperature source Oral, resp. rate 16, height 5\' 3"  (1.6 m), weight 64.411 kg (142 lb), last menstrual period 01/18/2012, unknown if currently breastfeeding.  Physical Exam:  General: alert and cooperative Lochia: appropriate Uterine Fundus: firm   Recent Labs  10/23/12 1615 10/24/12 0625  HGB 11.3* 9.4*  HCT 32.8* 27.3*    Assessment/Plan: Plan for discharge tomorrow   LOS: 1 day   Angelica Montgomery W 10/24/2012, 7:00 AM

## 2012-10-24 NOTE — Progress Notes (Signed)
UR chart review completed.  

## 2012-10-24 NOTE — Progress Notes (Signed)
I stopped by to check on patient's needs. 

## 2012-10-25 MED ORDER — OXYCODONE-ACETAMINOPHEN 5-325 MG PO TABS: 1.0000 | ORAL_TABLET | ORAL | Status: DC | PRN

## 2012-10-25 MED ORDER — IBUPROFEN 600 MG PO TABS: 600.0000 mg | ORAL_TABLET | Freq: Four times a day (QID) | ORAL | Status: DC

## 2012-10-25 NOTE — Progress Notes (Signed)
Post Partum Day 2 Subjective: no complaints and tolerating PO  Objective: Blood pressure 116/77, pulse 88, temperature 98 F (36.7 C), temperature source Oral, resp. rate 18, height 5\' 3"  (1.6 m), weight 64.411 kg (142 lb), last menstrual period 01/18/2012, unknown if currently breastfeeding.  Physical Exam:  General: alert and cooperative Lochia: appropriate Uterine Fundus: firm    Recent Labs  10/23/12 1615 10/24/12 0625  HGB 11.3* 9.4*  HCT 32.8* 27.3*    Assessment/Plan: Discharge home Reviewed d/c in spanish with pt and husband   LOS: 2 days   Revis Whalin W 10/25/2012, 9:29 AM

## 2012-10-25 NOTE — Clinical Social Work Maternal (Signed)
    LATE ENTRY FROM 10/25/12:  Clinical Social Work Department PSYCHOSOCIAL ASSESSMENT - MATERNAL/CHILD 10/25/2012  Patient:  Angelica Montgomery, Angelica Montgomery  Account Number:  [id number]  Admit Date:  10/23/2012  Marjo Bicker Name:   Desma Paganini    Clinical Social Worker:  Nobie Putnam, LCSW   Date/Time:  10/24/2012 03:00 PM  Date Referred:  10/24/2012   Referral source  CN     Referred reason  [place]   Other referral source:    I:  FAMILY / HOME ENVIRONMENT Child's legal guardian:  PARENT  Guardian - Name Guardian - Age Guardian - Address  Angelica Montgomery 22 [address] [place]; [place], [place] [address]  Angelica Montgomery 37 (same as above)   Other household support members/support persons Other support:    II  PSYCHOSOCIAL DATA Information Source:  Patient Interview  Event organiser Employment:   Surveyor, quantity resources:  [employer] If Medicaid - County:  [place] Other  WIC   School / Grade:   Maternity Care Coordinator / Child Services Coordination / Early Interventions:  Cultural issues impacting care:    III  STRENGTHS Strengths  Adequate Resources  Home prepared for Child (including basic supplies)  Supportive family/friends   Strength comment:    IV  RISK FACTORS AND CURRENT PROBLEMS Current Problem:  YES   Risk Factor & Current Problem Patient Issue Family Issue Risk Factor / Current Problem Comment  Other - See comment Y N LPNC @ 30 weeks    V  SOCIAL WORK ASSESSMENT CSW met with pt to assess reason for [facility] @ 30 weeks.  Pt told CSW that [facility] would not see her because the did not have proper paperwork.  Once she was approved for Medicaid, she established [facility] with Dr. Ambrose Mantle at 6 months. She denies illegal substance use & verbalized understanding of hospital drug testing policy.  UDS is negative, meconium results are pending.  She has all the necessary supplies for the infant & support from FOB.  Pt appears bonding well with the  infant & appropriate at this time.  CSW will continue to monitor drug screen results & make a referral if needed.      VI SOCIAL WORK PLAN Social Work Plan  No Further Intervention Required / No Barriers to Discharge   Type of pt/family education:   If child protective services report - county:   If child protective services report - date:   Information/referral to community resources comment:   Other social work plan:

## 2012-10-25 NOTE — Discharge Summary (Signed)
Obstetric Discharge Summary Reason for Admission: onset of labor Prenatal Procedures: none Intrapartum Procedures: spontaneous vaginal delivery (precipitous) Postpartum Procedures: none Complications-Operative and Postpartum: second  degree perineal laceration and left sulcal Hemoglobin  Date Value Range Status  10/24/2012 9.4* 12.0 - 15.0 g/dL Final     REPEATED TO VERIFY     DELTA CHECK NOTED     HCT  Date Value Range Status  10/24/2012 27.3* 36.0 - 46.0 % Final    Physical Exam:  General: alert and cooperative Lochia: appropriate Uterine Fundus: firm   Discharge Diagnoses: Term Pregnancy-delivered  Discharge Information: Date: 10/25/2012 Activity: pelvic rest Diet: routine Medications: Ibuprofen and Percocet Condition: improved Instructions: refer to practice specific booklet Discharge to: home   Newborn Data: Live born female  Birth Weight: 6 lb 5.8 oz (2886 g) APGAR: 9, 9  Home with mother.  Oliver Pila 10/25/2012, 9:36 AM

## 2014-02-17 ENCOUNTER — Encounter (HOSPITAL_COMMUNITY): Payer: Self-pay

## 2015-07-18 ENCOUNTER — Emergency Department (HOSPITAL_COMMUNITY): Payer: Self-pay

## 2015-07-18 ENCOUNTER — Encounter (HOSPITAL_COMMUNITY): Payer: Self-pay | Admitting: Emergency Medicine

## 2015-07-18 ENCOUNTER — Emergency Department (HOSPITAL_COMMUNITY)
Admission: EM | Admit: 2015-07-18 | Discharge: 2015-07-18 | Disposition: A | Discharge status: Home / Self Care | Payer: Self-pay | Attending: Emergency Medicine | Admitting: Emergency Medicine

## 2015-07-18 DIAGNOSIS — Z3202 Encounter for pregnancy test, result negative: Secondary | ICD-10-CM | POA: Insufficient documentation

## 2015-07-18 DIAGNOSIS — R1013 Epigastric pain: Secondary | ICD-10-CM | POA: Insufficient documentation

## 2015-07-18 DIAGNOSIS — R1011 Right upper quadrant pain: Secondary | ICD-10-CM

## 2015-07-18 DIAGNOSIS — R1010 Upper abdominal pain, unspecified: Secondary | ICD-10-CM

## 2015-07-18 DIAGNOSIS — R110 Nausea: Secondary | ICD-10-CM

## 2015-07-18 DIAGNOSIS — R42 Dizziness and giddiness: Secondary | ICD-10-CM

## 2015-07-18 LAB — COMPREHENSIVE METABOLIC PANEL
ALK PHOS: 46 U/L (ref 38–126)
ALT: 12 U/L — AB (ref 14–54)
AST: 20 U/L (ref 15–41)
Albumin: 4.1 g/dL (ref 3.5–5.0)
Anion gap: 10 (ref 5–15)
BUN: 5 mg/dL — AB (ref 6–20)
CALCIUM: 9.3 mg/dL (ref 8.9–10.3)
CO2: 23 mmol/L (ref 22–32)
CREATININE: 0.56 mg/dL (ref 0.44–1.00)
Chloride: 104 mmol/L (ref 101–111)
Glucose, Bld: 123 mg/dL — ABNORMAL HIGH (ref 65–99)
Potassium: 3.6 mmol/L (ref 3.5–5.1)
Sodium: 137 mmol/L (ref 135–145)
Total Bilirubin: 0.4 mg/dL (ref 0.3–1.2)
Total Protein: 7.8 g/dL (ref 6.5–8.1)

## 2015-07-18 LAB — CBC WITH DIFFERENTIAL/PLATELET
Basophils Absolute: 0 10*3/uL (ref 0.0–0.1)
Basophils Relative: 0 %
Eosinophils Absolute: 0 10*3/uL (ref 0.0–0.7)
Eosinophils Relative: 0 %
HCT: 32.7 % — ABNORMAL LOW (ref 36.0–46.0)
HEMOGLOBIN: 11.3 g/dL — AB (ref 12.0–15.0)
LYMPHS ABS: 1.6 10*3/uL (ref 0.7–4.0)
LYMPHS PCT: 20 %
MCH: 29.4 pg (ref 26.0–34.0)
MCHC: 34.6 g/dL (ref 30.0–36.0)
MCV: 85.2 fL (ref 78.0–100.0)
Monocytes Absolute: 0.4 10*3/uL (ref 0.1–1.0)
Monocytes Relative: 5 %
NEUTROS ABS: 6 10*3/uL (ref 1.7–7.7)
NEUTROS PCT: 75 %
Platelets: 347 10*3/uL (ref 150–400)
RBC: 3.84 MIL/uL — AB (ref 3.87–5.11)
RDW: 17.2 % — ABNORMAL HIGH (ref 11.5–15.5)
WBC: 7.9 10*3/uL (ref 4.0–10.5)

## 2015-07-18 LAB — POC URINE PREG, ED: Preg Test, Ur: NEGATIVE

## 2015-07-18 LAB — URINALYSIS, ROUTINE W REFLEX MICROSCOPIC
BILIRUBIN URINE: NEGATIVE
Glucose, UA: NEGATIVE mg/dL
HGB URINE DIPSTICK: NEGATIVE
KETONES UR: NEGATIVE mg/dL
Leukocytes, UA: NEGATIVE
NITRITE: NEGATIVE
PH: 6.5 (ref 5.0–8.0)
Protein, ur: NEGATIVE mg/dL
Specific Gravity, Urine: 1.014 (ref 1.005–1.030)

## 2015-07-18 LAB — LIPASE, BLOOD: LIPASE: 29 U/L (ref 11–51)

## 2015-07-18 MED ORDER — ONDANSETRON HCL 4 MG/2ML IJ SOLN
4.0000 mg | Freq: Once | INTRAMUSCULAR | Status: AC
  Administered 2015-07-18: 4 mg via INTRAVENOUS
  Filled 2015-07-18: qty 2

## 2015-07-18 MED ORDER — OMEPRAZOLE 20 MG PO CPDR: 20.0000 mg | DELAYED_RELEASE_CAPSULE | Freq: Every day | ORAL | Status: DC

## 2015-07-18 MED ORDER — PROMETHAZINE HCL 25 MG PO TABS: 25.0000 mg | ORAL_TABLET | Freq: Four times a day (QID) | ORAL | Status: DC | PRN

## 2015-07-18 MED ORDER — MORPHINE SULFATE (PF) 4 MG/ML IV SOLN
4.0000 mg | Freq: Once | INTRAVENOUS | Status: AC
  Administered 2015-07-18: 4 mg via INTRAVENOUS
  Filled 2015-07-18: qty 1

## 2015-07-18 NOTE — ED Notes (Signed)
Pt back from CT

## 2015-07-18 NOTE — ED Notes (Signed)
Pt to US.

## 2015-07-18 NOTE — Discharge Instructions (Signed)
Dolor abdominal en adultos (Abdominal Pain, Adult) El dolor puede tener muchas causas. Normalmente la causa del dolor abdominal no es una enfermedad y Scientist, clinical (histocompatibility and immunogenetics) sin TEFL teacher. Frecuentemente puede controlarse y tratarse en casa. Su mdico le Medical sales representative examen fsico y posiblemente solicite anlisis de sangre y radiografas para ayudar a Chief Strategy Officer la gravedad de su dolor. Sin embargo, en [employer], debe transcurrir ms tiempo antes de que se pueda Clinical research associate una causa evidente del dolor. Antes de llegar a ese punto, es posible que su mdico no sepa si necesita ms pruebas o un tratamiento ms profundo. INSTRUCCIONES PARA EL CUIDADO EN EL HOGAR  Est atento al dolor para ver si hay cambios. Las siguientes indicaciones ayudarn a Architectural technologist que pueda sentir:  [place] solo medicamentos de venta libre o recetados, segn las indicaciones del mdico.  No tome laxantes a menos que se lo haya indicado su mdico.  Pruebe con Neomia Dear dieta lquida absoluta (caldo, t o agua) segn se lo indique su mdico. Introduzca gradualmente una dieta normal, segn su tolerancia. SOLICITE ATENCIN MDICA SI:  Tiene dolor abdominal sin explicacin.  Tiene dolor abdominal relacionado con nuseas o diarrea.  Tiene dolor cuando orina o defeca.  Experimenta dolor abdominal que lo despierta de noche.  Tiene dolor abdominal que empeora o mejora cuando come alimentos.  Tiene dolor abdominal que empeora cuando come alimentos grasosos.  Tiene fiebre. SOLICITE ATENCIN MDICA DE INMEDIATO SI:   El dolor no desaparece en un plazo mximo de 2horas.  No deja de (vomitar).  El Engineer, mining se siente solo en partes del abdomen, como el lado derecho o la parte inferior izquierda del abdomen.  Evaca materia fecal sanguinolenta o negra, de aspecto alquitranado. ASEGRESE DE QUE:  Comprende estas instrucciones.  Controlar su afeccin.  Recibir ayuda de inmediato si no mejora o si empeora.   Esta  informacin no tiene Theme park manager el consejo del mdico. Asegrese de hacerle al mdico cualquier pregunta que tenga.   Document Released: 04/04/2005 Document Revised: 04/25/2014 Elsevier Interactive Patient Education 2016 [employer].  [place] (Dizziness) Los mareos son un problema muy frecuente. Se trata de una sensacin de inestabilidad o de desvanecimiento. Puede sentir que se va a desmayar. Un mareo puede provocarle una lesin si se tropieza o se cae. Las Dealer de todas las edades pueden sufrir Research scientist (life sciences), Biomedical engineer es ms frecuente en los adultos [place]. Esta afeccin puede tener muchas causas, entre las que se pueden [employer], la deshidratacin y [place]. INSTRUCCIONES PARA EL CUIDADO EN EL HOGAR Estas indicaciones pueden ayudarlo con el trastorno: Comida y bebida  Beba suficiente lquido para Pharmacologist la orina clara o de color amarillo plido. Esto evita la deshidratacin. Trate de beber ms lquidos transparentes, como agua.  No beba alcohol.  Limite el consumo de cafena si el mdico se lo indica.  Limite el consumo de sal si el mdico se lo indica. Actividad  Evite los movimientos rpidos.  Levntese de las sillas con lentitud y apyese hasta sentirse bien.  Por la maana, sintese primero a un lado de la cama. Cuando se sienta bien, pngase lentamente de [address] se sostiene de algo, hasta que sepa que ha logrado el equilibrio.  Mueva las piernas con frecuencia si debe estar de pie en un lugar durante mucho tiempo. Mientras est de pie, contraiga y relaje los msculos de las piernas.  No conduzca vehculos ni opere maquinaria pesada si se siente mareado.  Evite agacharse si se siente mareado. En su  casa, coloque los objetos de modo que le resulte fcil alcanzarlos sin Public librarianagacharse. Estilo de vida  No consuma ningn producto que contenga tabaco, lo que incluye cigarrillos, tabaco de Theatre managermascar o Administrator, Civil Servicecigarrillos electrnicos. Si necesita ayuda para  dejar de fumar, consulte al American Expressmdico.  Trate de reducir el nivel de estrs practicando actividades como el yoga o la meditacin. Hable con el mdico si necesita ayuda. Instrucciones generales  Controle sus mareos para ver si hay cambios.  Tome los medicamentos solamente como se lo haya indicado el mdico. Hable con el mdico si cree que los medicamentos que est tomando son la causa de sus mareos.  Infrmele a un amigo o a un familiar si se siente mareado. Pdale a esta persona que llame al mdico si observa cambios en su comportamiento.  Concurra a todas las visitas de control como se lo haya indicado el mdico. Esto es importante. SOLICITE ATENCIN MDICA SI:  Los American Expressmareos persisten.  Los Golden West Financialmareos o la sensacin de Production assistant, radiodesvanecimiento empeoran.  Siente nuseas.  Ha perdido la audicin.  Aparecen nuevos sntomas.  Cuando est de pie se siente inestable o que la habitacin da vueltas. SOLICITE ATENCIN MDICA DE INMEDIATO SI:  Vomita o tiene diarrea y no puede comer ni beber nada.  Tiene dificultad para hablar, para caminar, para tragar o para Boeingusar los brazos, las manos o las piernas.  Siente una debilidad generalizada.  No piensa con claridad o tiene dificultades para armar oraciones. Es posible que un amigo o un familiar adviertan que esto ocurre.  Tiene dolor de pecho, dolor abdominal, sudoracin o Company secretaryle falta el aire.  Hay cambios en la visin.  Observa un sangrado.  Tiene dolores de Turkmenistancabeza.  Tiene dolor o rigidez en el cuello.  Tiene fiebre.   Esta informacin no tiene Theme park managercomo fin reemplazar el consejo del mdico. Asegrese de hacerle al mdico cualquier pregunta que tenga.   Document Released: 04/04/2005 Document Revised: 08/19/2014 Elsevier Interactive Patient Education [employer]2016 Elsevier Inc.

## 2015-07-18 NOTE — ED Provider Notes (Signed)
CSN: [id number]     Arrival date & time 07/18/15  0108 History  By signing my name below, I, Arianna Nassar, attest that this documentation has been prepared under the direction and in the presence of Azalia BilisKevin Arleatha Philipps, MD. Electronically Signed: Octavia HeirArianna Nassar, ED Scribe. 07/18/2015. 4:05 AM.    Chief Complaint  Patient presents with  . Dizziness      The history is provided by the patient. No language interpreter was used.   HPI Comments: Angelica Montgomery is a 25 y.o. female who presents to the Emergency Department complaining of intermittent, gradual worsening, epigastric abdominal pain onset about 6 hours ago. She has been having associated headache and bilateral shoulder pain. Denies diarrhea, chest pain, shortness of breath, dysuria, or burning with urination.She reports some dizziness and lightheadedness.  She's had mild decreased oral intake over the past several days of mild anorexia.  Denies lower abdominal pain.  Symptoms are moderate in severity Past Medical History  Diagnosis Date  . Anemia   . Late prenatal care   . IUGR, antenatal    History reviewed. No pertinent past surgical history. No family history on file. Social History  Substance Use Topics  . Smoking status: Never Smoker   . Smokeless tobacco: None  . Alcohol Use: No   OB History    Gravida Para Term Preterm AB TAB SAB Ectopic Multiple Living   1 1 1       1      Review of Systems  A complete 10 system review of systems was obtained and all systems are negative except as noted in the HPI and PMH.    Allergies  Review of patient's allergies indicates no known allergies.  Home Medications   Prior to Admission medications   Medication Sig Start Date End Date Taking? Authorizing Provider  ibuprofen (ADVIL,MOTRIN) 600 MG tablet Take 1 tablet (600 mg total) by mouth every 6 (six) hours. 10/25/12   Huel CoteKathy Richardson, MD  oxyCODONE-acetaminophen (PERCOCET/ROXICET) 5-325 MG per tablet Take 1-2 tablets by mouth  every 4 (four) hours as needed. 10/25/12   Huel CoteKathy Richardson, MD   Triage vitals: BP 133/79 mmHg  Pulse 93  Temp(Src) 98.1 F (36.7 C) (Oral)  Resp 20  Ht 5\' 4"  (1.626 m)  Wt 114 lb (51.71 kg)  BMI 19.56 kg/m2  SpO2 100%  LMP 06/20/2015  Breastfeeding? Yes Physical Exam  Constitutional: She is oriented to person, place, and time. She appears well-developed and well-nourished.  HENT:  Head: Normocephalic.  Eyes: EOM are normal.  Neck: Normal range of motion.  Pulmonary/Chest: Effort normal.  Abdominal: She exhibits no distension. There is tenderness.  Mild epigastric and RUQ tenderness  Musculoskeletal: Normal range of motion.  Neurological: She is alert and oriented to person, place, and time.  Psychiatric: She has a normal mood and affect.  Nursing note and vitals reviewed.   ED Course  Procedures  DIAGNOSTIC STUDIES: Oxygen Saturation is 100% on RA, normal by my interpretation.  COORDINATION OF CARE:  3:59 AM Discussed treatment plan which includes zofran and morphine with pt at bedside and pt agreed to plan.  Labs Review Labs Reviewed  CBC WITH DIFFERENTIAL/PLATELET - Abnormal; Notable for the following:    RBC 3.84 (*)    Hemoglobin 11.3 (*)    HCT 32.7 (*)    RDW 17.2 (*)    All other components within normal limits  COMPREHENSIVE METABOLIC PANEL - Abnormal; Notable for the following:    Glucose, Bld 123 (*)  BUN 5 (*)    ALT 12 (*)    All other components within normal limits  URINALYSIS, ROUTINE W REFLEX MICROSCOPIC (NOT AT [facility]) - Abnormal; Notable for the following:    APPearance CLOUDY (*)    All other components within normal limits  LIPASE, BLOOD  POC URINE PREG, ED    Imaging Review US Abdomen Limited Ruq  07/18/2015  CLINICAL DATA:  RIGHT upper quadrant pain. EXAM: US ABDOMEN LIMITED - RIGHT UPPER QUADRANT COMPARISON:  None. FINDINGS: Gallbladder: No gallstones or wall thickening visualized. No sonographic Murphy sign noted by sonographer. Common  bile duct: Diameter: 2 mm Liver: No focal lesion identified. Within normal limits in parenchymal echogenicity. Hepatopetal portal vein. IMPRESSION: Negative RIGHT upper quadrant ultrasound. Electronically Signed   By: Awilda Metro M.D.   On: 07/18/2015 05:08   I have personally reviewed and evaluated these images and lab results as part of my medical decision-making.   EKG Interpretation None      MDM   Final diagnoses:  Pain of upper abdomen  Nausea  Dizziness   Overall the patient is well-appearing.  Discharge home in good condition.  Ultrasound without cholelithiasis or other abnormality.  No represent gastritis/GERD.  Outpatient primary care follow-up.  She understands to return to the emergency department for new or worsening symptoms   I personally performed the services described in this documentation, which was scribed in my presence. The recorded information has been reviewed and is accurate.       Azalia Bilis, MD 07/18/15 [phone]

## 2015-07-18 NOTE — ED Notes (Signed)
Pt. reports intermittent dizziness /lightheaded this week , brief LOC last Wednesday , alert and oriented , denies SOB or discomfort.

## 2016-12-04 ENCOUNTER — Emergency Department (HOSPITAL_COMMUNITY)
Admission: EM | Admit: 2016-12-04 | Discharge: 2016-12-05 | Disposition: A | Discharge status: Home / Self Care | Payer: Medicaid Other | Attending: Emergency Medicine | Admitting: Emergency Medicine

## 2016-12-04 ENCOUNTER — Encounter (HOSPITAL_COMMUNITY): Payer: Self-pay | Admitting: Emergency Medicine

## 2016-12-04 DIAGNOSIS — K2900 Acute gastritis without bleeding: Secondary | ICD-10-CM | POA: Insufficient documentation

## 2016-12-04 DIAGNOSIS — R1013 Epigastric pain: Secondary | ICD-10-CM | POA: Insufficient documentation

## 2016-12-04 LAB — COMPREHENSIVE METABOLIC PANEL
ALK PHOS: 49 U/L (ref 38–126)
ALT: 13 U/L — AB (ref 14–54)
AST: 25 U/L (ref 15–41)
Albumin: 4.4 g/dL (ref 3.5–5.0)
Anion gap: 13 (ref 5–15)
BUN: 10 mg/dL (ref 6–20)
CHLORIDE: 104 mmol/L (ref 101–111)
CO2: 19 mmol/L — AB (ref 22–32)
CREATININE: 0.71 mg/dL (ref 0.44–1.00)
Calcium: 9.9 mg/dL (ref 8.9–10.3)
GFR calc Af Amer: >60 mL/min (ref >60)
Glucose, Bld: 143 mg/dL — ABNORMAL HIGH (ref 65–99)
Potassium: 3.3 mmol/L — ABNORMAL LOW (ref 3.5–5.1)
Sodium: 136 mmol/L (ref 135–145)
Total Bilirubin: 0.9 mg/dL (ref 0.3–1.2)
Total Protein: 8.1 g/dL (ref 6.5–8.1)

## 2016-12-04 LAB — CBC
HEMATOCRIT: 34.2 % — AB (ref 36.0–46.0)
HEMOGLOBIN: 11.9 g/dL — AB (ref 12.0–15.0)
MCH: 29.4 pg (ref 26.0–34.0)
MCHC: 34.8 g/dL (ref 30.0–36.0)
MCV: 84.4 fL (ref 78.0–100.0)
Platelets: 373 10*3/uL (ref 150–400)
RBC: 4.05 MIL/uL (ref 3.87–5.11)
RDW: 14.9 % (ref 11.5–15.5)
WBC: 10.9 10*3/uL — AB (ref 4.0–10.5)

## 2016-12-04 LAB — URINALYSIS, ROUTINE W REFLEX MICROSCOPIC
BILIRUBIN URINE: NEGATIVE
Glucose, UA: NEGATIVE mg/dL
Ketones, ur: 80 mg/dL — AB
LEUKOCYTES UA: NEGATIVE
Nitrite: NEGATIVE
Protein, ur: 30 mg/dL — AB
SPECIFIC GRAVITY, URINE: 1.027 (ref 1.005–1.030)
pH: 5 (ref 5.0–8.0)

## 2016-12-04 LAB — I-STAT BETA HCG BLOOD, ED (MC, WL, AP ONLY)

## 2016-12-04 LAB — LIPASE, BLOOD: LIPASE: 34 U/L (ref 11–51)

## 2016-12-04 MED ORDER — ONDANSETRON 4 MG PO TBDP
ORAL_TABLET | ORAL | Status: AC
  Filled 2016-12-04: qty 1

## 2016-12-04 MED ORDER — SODIUM CHLORIDE 0.9 % IV BOLUS (SEPSIS)
1000.0000 mL | Freq: Once | INTRAVENOUS | Status: AC
  Administered 2016-12-04: 1000 mL via INTRAVENOUS

## 2016-12-04 MED ORDER — GI COCKTAIL ~~LOC~~
30.0000 mL | Freq: Once | ORAL | Status: AC
  Administered 2016-12-04: 30 mL via ORAL
  Filled 2016-12-04: qty 30

## 2016-12-04 MED ORDER — ONDANSETRON HCL 4 MG/2ML IJ SOLN
4.0000 mg | Freq: Once | INTRAMUSCULAR | Status: AC
  Administered 2016-12-04: 4 mg via INTRAVENOUS
  Filled 2016-12-04: qty 2

## 2016-12-04 MED ORDER — RANITIDINE HCL 150 MG PO CAPS: 150.0000 mg | ORAL_CAPSULE | Freq: Every evening | ORAL | 3 refills | Status: AC

## 2016-12-04 MED ORDER — ONDANSETRON 4 MG PO TBDP
4.0000 mg | ORAL_TABLET | Freq: Once | ORAL | Status: AC | PRN
  Administered 2016-12-04: 4 mg via ORAL

## 2016-12-04 NOTE — Discharge Instructions (Signed)
Your work up was reassuring today. Please return to ED with any new or concerning symptoms. Please call Number provided to set up follow-up with a primary care physician.

## 2016-12-04 NOTE — ED Notes (Signed)
EDP at bedside at this time.  

## 2016-12-04 NOTE — ED Provider Notes (Signed)
[facility]-EMERGENCY DEPT Provider Note   CSN: [id number] Arrival date & time: 12/04/16  1958     History   Chief Complaint Chief Complaint  Patient presents with  . Abdominal Pain    HPI Angelica Montgomery is a 26 y.o. female.  HPI Patient presents with abdominal pain and vomiting. She woke up at [facility] with symptoms of not feeling well. She started vomiting. She also had some upper abdominal pain. She denies any diarrhea, vaginal discharge, or urinary complaints. Last BM reported as two days ago, reported as normal. Denies any other sick contacts. Denies any recent travel.   Past Medical History:  Diagnosis Date  . Anemia   . IUGR, antenatal   . Late prenatal care     Patient Active Problem List   Diagnosis Date Noted  . NSVD (normal spontaneous vaginal delivery) 10/25/2012    History reviewed. No pertinent surgical history.  OB History    Gravida Para Term Preterm AB Living   1 1 1     1    SAB TAB Ectopic Multiple Live Births           1       Home Medications    Prior to Admission medications   Medication Sig Start Date End Date Taking? Authorizing Provider  ranitidine (ZANTAC) 150 MG capsule Take 1 capsule (150 mg total) by mouth every evening. 12/04/16   Wynelle Cleveland, MD    Family History History reviewed. No pertinent family history.  Social History Social History  Substance Use Topics  . Smoking status: Never Smoker  . Smokeless tobacco: Never Used  . Alcohol use No     Allergies   Patient has no known allergies.   Review of Systems Review of Systems  Constitutional: Positive for chills. Negative for fever.  HENT: Negative for ear pain and sore throat.   Eyes: Negative for pain and visual disturbance.  Respiratory: Negative for cough and shortness of breath.   Cardiovascular: Negative for chest pain and palpitations.  Gastrointestinal: Positive for abdominal pain, nausea and vomiting.  Genitourinary: Negative for dysuria and hematuria.    Musculoskeletal: Negative for arthralgias and back pain.  Skin: Negative for color change and rash.  Neurological: Negative for seizures and syncope.  All other systems reviewed and are negative.    Physical Exam Updated Vital Signs BP (!) 108/59   Pulse 80   Temp 98.2 F (36.8 C) (Oral)   Resp 16   Ht 5\' 7"  (1.702 m)   Wt 52.2 kg (115 lb)   SpO2 100%   BMI 18.01 kg/m   Physical Exam  Constitutional: She appears well-developed and well-nourished. She appears distressed.  HENT:  Head: Normocephalic and atraumatic.  Eyes: Conjunctivae are normal.  Neck: Neck supple.  Cardiovascular: Normal rate and regular rhythm.   No murmur heard. Pulmonary/Chest: Effort normal and breath sounds normal. No respiratory distress.  Abdominal: Soft. There is tenderness (epigastric).  Musculoskeletal: She exhibits no edema.  Neurological: She is alert.  Skin: Skin is warm and dry.  Psychiatric: She has a normal mood and affect.  Nursing note and vitals reviewed.    ED Treatments / Results  Labs (all labs ordered are listed, but only abnormal results are displayed) Labs Reviewed  COMPREHENSIVE METABOLIC PANEL - Abnormal; Notable for the following:       Result Value   Potassium 3.3 (*)    CO2 19 (*)    Glucose, Bld 143 (*)    ALT 13 (*)  All other components within normal limits  CBC - Abnormal; Notable for the following:    WBC 10.9 (*)    Hemoglobin 11.9 (*)    HCT 34.2 (*)    All other components within normal limits  URINALYSIS, ROUTINE W REFLEX MICROSCOPIC - Abnormal; Notable for the following:    APPearance HAZY (*)    Hgb urine dipstick MODERATE (*)    Ketones, ur 80 (*)    Protein, ur 30 (*)    Bacteria, UA RARE (*)    Squamous Epithelial / LPF 0-5 (*)    All other components within normal limits  LIPASE, BLOOD  I-STAT BETA HCG BLOOD, ED ([facility], [facility], [facility] ONLY)    EKG  EKG Interpretation None       Radiology No results found.  Procedures Procedures  (including critical care time)  Medications Ordered in ED Medications  ondansetron (ZOFRAN-ODT) disintegrating tablet 4 mg (4 mg Oral Given 12/04/16 2016)  sodium chloride 0.9 % bolus 1,000 mL (0 mLs Intravenous Stopped 12/04/16 2324)  ondansetron (ZOFRAN) injection 4 mg (4 mg Intravenous Given 12/04/16 2221)  gi cocktail (Maalox,Lidocaine,Donnatal) (30 mLs Oral Given 12/04/16 2221)     Initial Impression / Assessment and Plan / ED Course  I have reviewed the triage vital signs and the nursing notes.  Pertinent labs & imaging results that were available during my care of the patient were reviewed by me and considered in my medical decision making (see chart for details).     Patient is a 26 year old female who presents with acute onset of epigastric abdominal pain, nausea and vomiting for one day. Patient arrived stable, and in mild distress. Exam as above, significant for mild epigastric tenderness to palpation.  Labs reassuring. Patient reporting history of indigestion and GERD type symptoms, not previously treated. She was seen back in April 2017 for similar abdominal pain. She was treated today with IV fluids, zofran and GI cocktail. On reassessment, abdominal pain improved. No evidence of acute abdominal process at this time.  Patient given rx for zantac, and given number for PCM follow up. Return precautions discussed. Patient in agreement with plan at time of discharge.    Patient and plan of care discussed with Attending physician, Dr. Jodi Mourning.    Final Clinical Impressions(s) / ED Diagnoses   Final diagnoses:  Epigastric pain  Acute gastritis without hemorrhage, unspecified gastritis type    New Prescriptions New Prescriptions   RANITIDINE (ZANTAC) 150 MG CAPSULE    Take 1 capsule (150 mg total) by mouth every evening.     Wynelle Cleveland, MD 12/04/16 1610    Blane Ohara, MD 12/07/16 [phone]

## 2016-12-04 NOTE — ED Triage Notes (Signed)
Pt presents to ED for assessment of abdominal pain starting this morning, waking her up from sleep, with nausea and vomiting.  Pt c/o some streaks of blood in her vomit.  Denies diarrhea or changes in urination.

## 2016-12-05 NOTE — ED Notes (Signed)
Patient able to ambulate independently  

## 2017-12-10 IMAGING — US US ABDOMEN LIMITED
1 series · 14 of 25 positions shown · non-contrast
Comparison: None.

CLINICAL DATA: RIGHT upper quadrant pain.

EXAM:
US ABDOMEN LIMITED - RIGHT UPPER QUADRANT

[Series 1: us abdomen limited · 0.20mm/px · 14 of 47 slices shown]
[im 1/47]
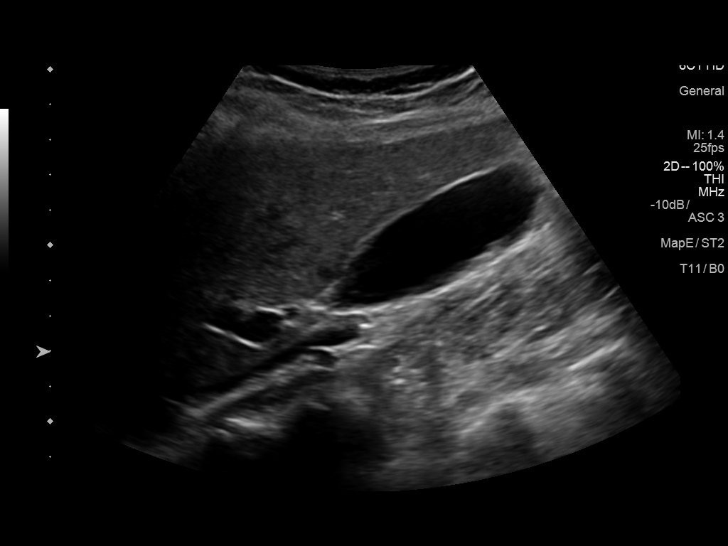
[im 4/47]
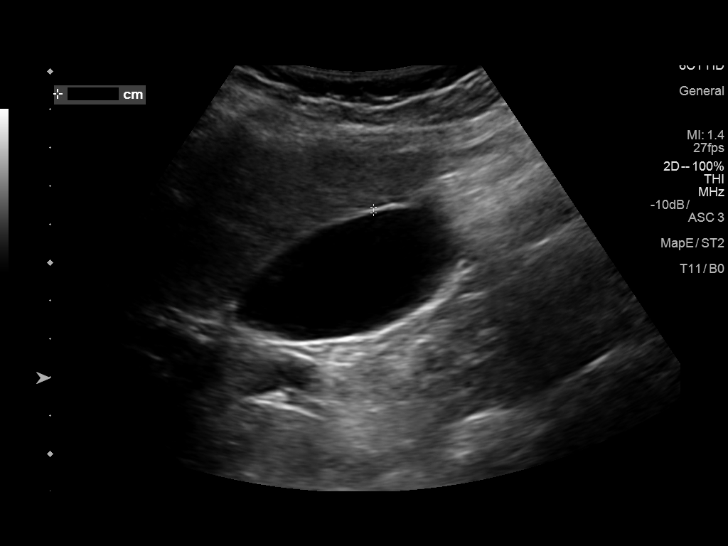
[im 8/47]
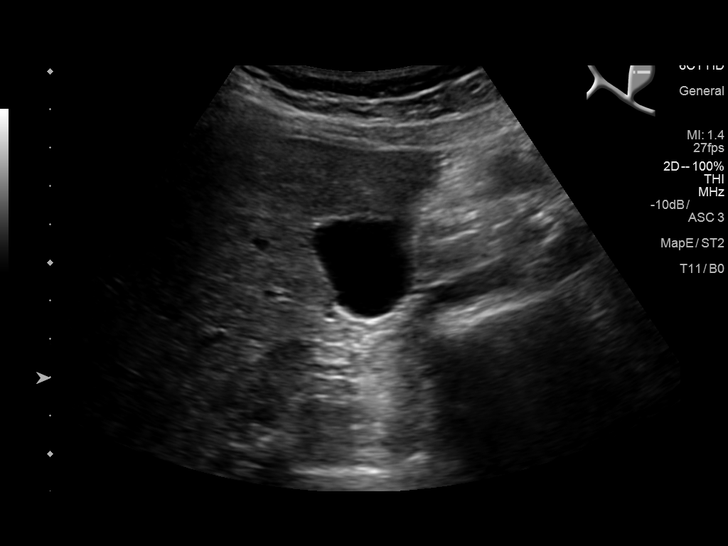
[im 12/47]
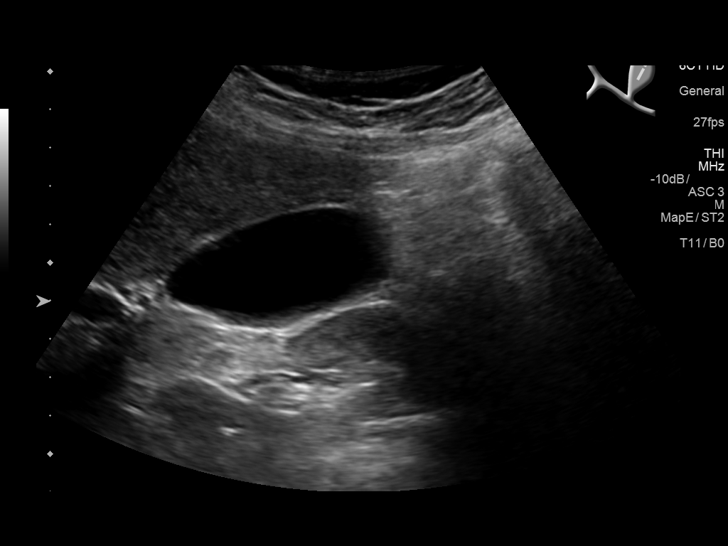
[im 16/47]
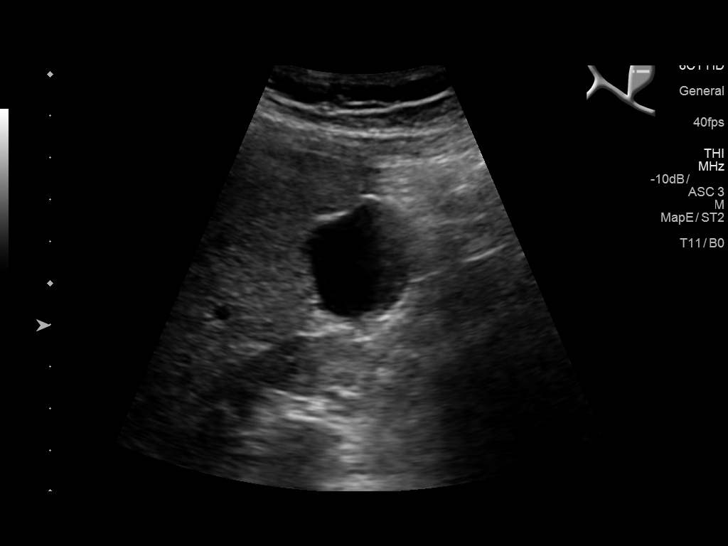
[im 18/47]
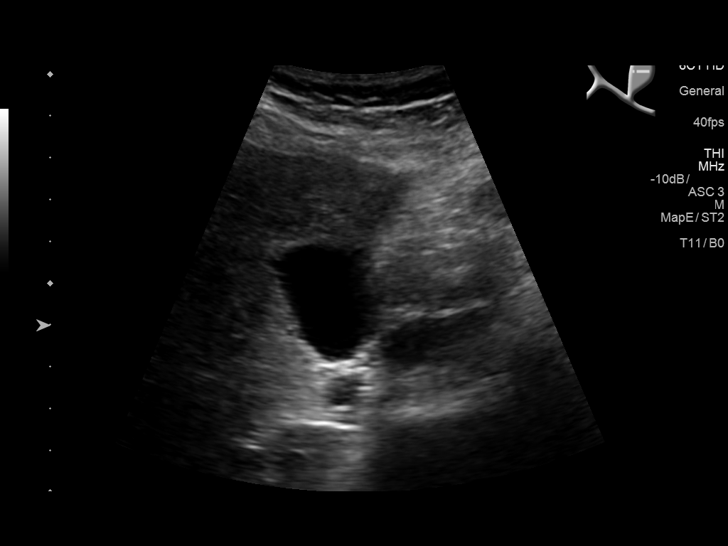
[im 22/47]
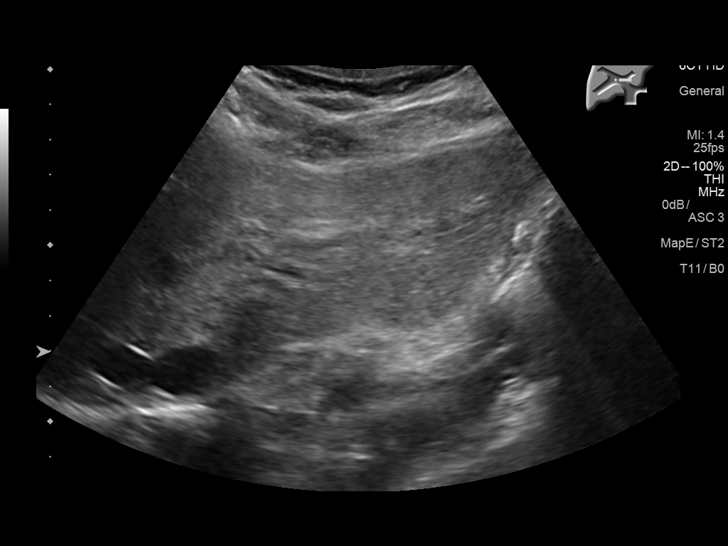
[im 25/47]
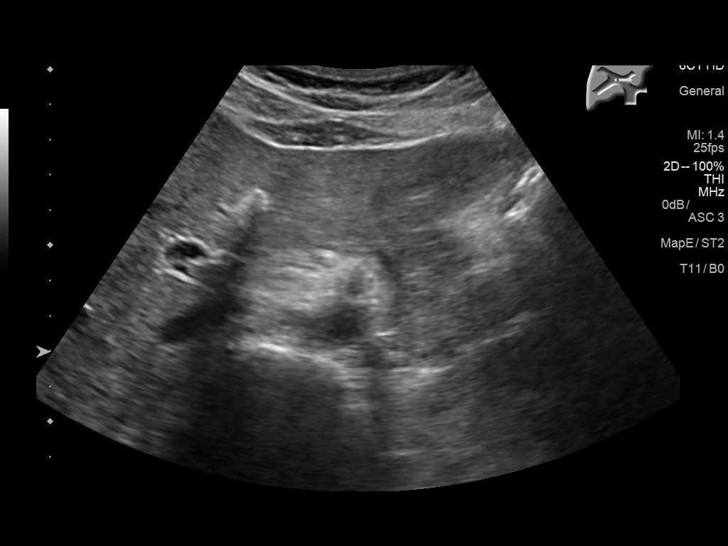
[im 29/47]
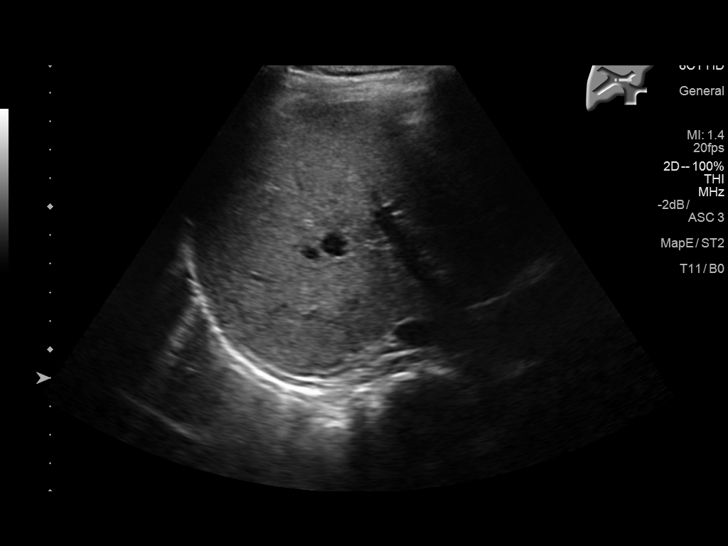
[im 31/47]
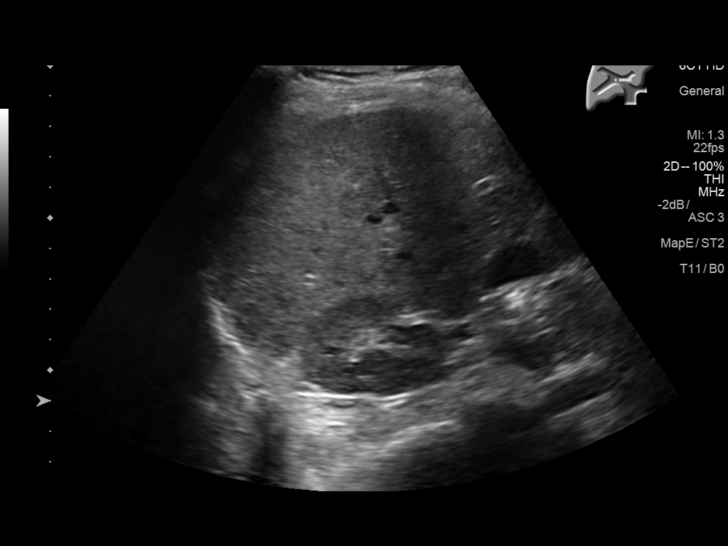
[im 35/47]
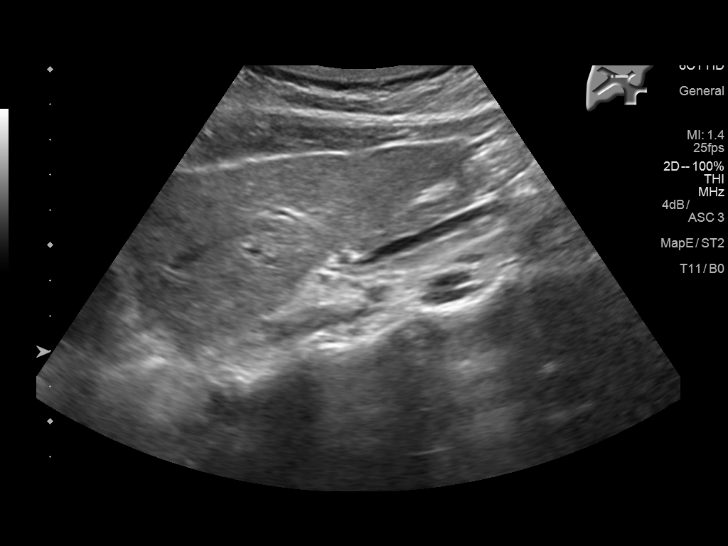
[im 39/47]
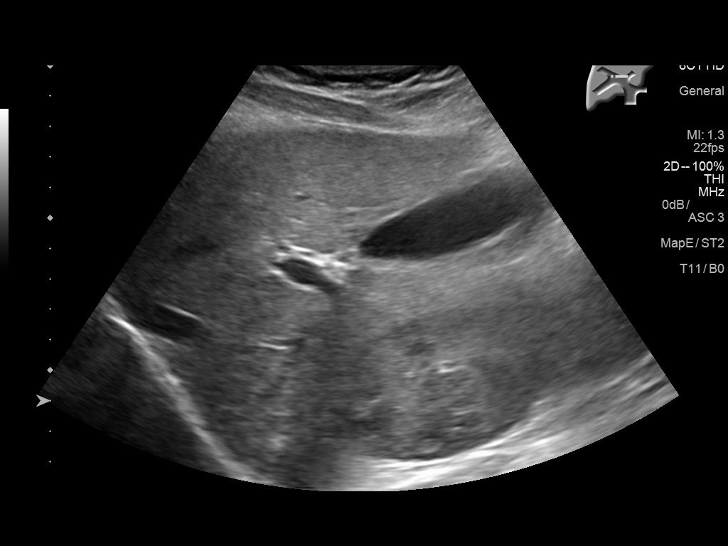
[im 43/47]
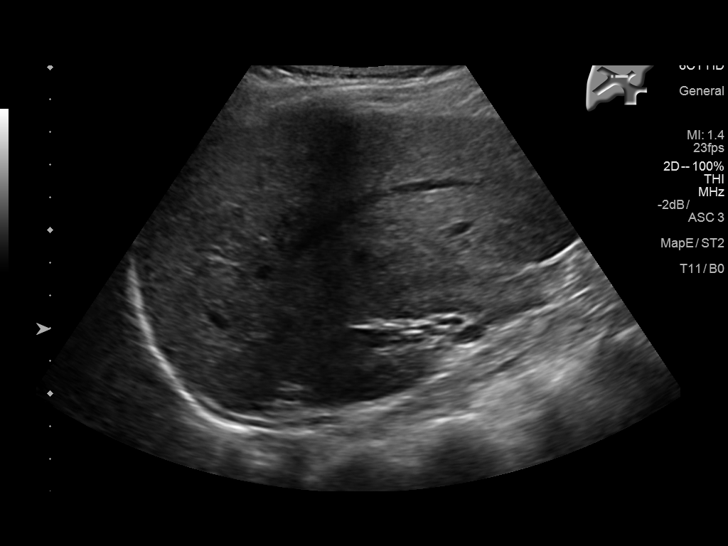
[im 47/47]
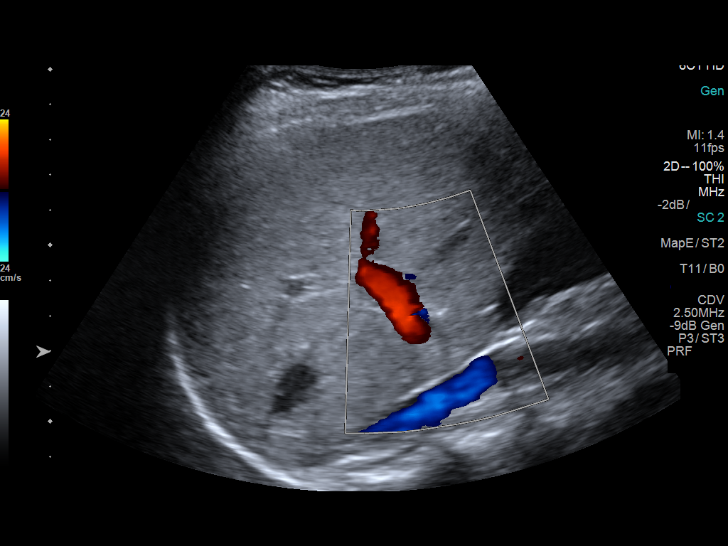

[14 of 25 positions shown; findings below may reference images not displayed]

FINDINGS: Gallbladder:

No gallstones or wall thickening visualized. No sonographic Murphy
sign noted by sonographer.

Common bile duct:

Diameter: 2 mm

Liver:

No focal lesion identified. Within normal limits in parenchymal
echogenicity. Hepatopetal portal vein.
IMPRESSION: Negative RIGHT upper quadrant ultrasound.
